# Patient Record
Sex: Male | Born: 1985 | Race: White | Hispanic: No | Marital: Single | Smoking: Never smoker
Health system: Southern US, Community
[De-identification: ages and names within clinical notes are randomized; demographics above are authoritative.]

## PROBLEM LIST (undated history)

## (undated) DIAGNOSIS — F1910 Other psychoactive substance abuse, uncomplicated: Secondary | ICD-10-CM

## (undated) DIAGNOSIS — L409 Psoriasis, unspecified: Secondary | ICD-10-CM

## (undated) HISTORY — PX: APPENDECTOMY: SHX54

---

## 2015-05-02 ENCOUNTER — Emergency Department (HOSPITAL_COMMUNITY): Payer: Self-pay

## 2015-05-02 ENCOUNTER — Emergency Department (HOSPITAL_COMMUNITY)
Admission: EM | Admit: 2015-05-02 | Discharge: 2015-05-02 | Disposition: A | Discharge status: Home / Self Care | Payer: Self-pay | Attending: Emergency Medicine | Admitting: Emergency Medicine

## 2015-05-02 ENCOUNTER — Encounter (HOSPITAL_COMMUNITY): Payer: Self-pay

## 2015-05-02 DIAGNOSIS — Z872 Personal history of diseases of the skin and subcutaneous tissue: Secondary | ICD-10-CM | POA: Insufficient documentation

## 2015-05-02 DIAGNOSIS — J069 Acute upper respiratory infection, unspecified: Secondary | ICD-10-CM | POA: Insufficient documentation

## 2015-05-02 DIAGNOSIS — K0889 Other specified disorders of teeth and supporting structures: Secondary | ICD-10-CM

## 2015-05-02 DIAGNOSIS — K029 Dental caries, unspecified: Secondary | ICD-10-CM | POA: Insufficient documentation

## 2015-05-02 HISTORY — DX: Psoriasis, unspecified: L40.9

## 2015-05-02 MED ORDER — IBUPROFEN 800 MG PO TABS
800.0000 mg | ORAL_TABLET | Freq: Once | ORAL | Status: AC
  Administered 2015-05-02: 800 mg via ORAL
  Filled 2015-05-02: qty 1

## 2015-05-02 MED ORDER — OXYCODONE-ACETAMINOPHEN 5-325 MG PO TABS: 1.0000 | ORAL_TABLET | ORAL | Status: DC | PRN

## 2015-05-02 MED ORDER — AMOXICILLIN 500 MG PO CAPS: 500.0000 mg | ORAL_CAPSULE | Freq: Three times a day (TID) | ORAL | Status: DC

## 2015-05-02 NOTE — ED Notes (Signed)
Pt c/o toothache x 1 month and cold symptoms x 4 days.

## 2015-05-02 NOTE — Discharge Instructions (Signed)
Meds for pain and antibiotics.  Referral to dentist.  OTC cough syrup.

## 2015-05-02 NOTE — ED Provider Notes (Signed)
CSN: [id number]     Arrival date & time 05/02/15  [id number] History   First MD Initiated Contact with Patient 05/02/15 [phone]     Chief Complaint  Patient presents with  . URI  . Dental Pain     (Consider location/radiation/quality/duration/timing/severity/associated sxs/prior Treatment) HPI.... Toothache for several weeks in left lower mandible. No fever, sweats, chills, stiff neck. Patient is also had a cough for 1 week. He is normally healthy. Severity of symptoms is mild.  Past Medical History  Diagnosis Date  . Psoriasis    Past Surgical History  Procedure Laterality Date  . Appendectomy     No family history on file. Social History  Substance Use Topics  . Smoking status: Never Smoker   . Smokeless tobacco: None  . Alcohol Use: No    Review of Systems  All other systems reviewed and are negative.     Allergies  Review of patient's allergies indicates no known allergies.  Home Medications   Prior to Admission medications   Medication Sig Start Date End Date Taking? Authorizing Provider  amoxicillin (AMOXIL) 500 MG capsule Take 1 capsule (500 mg total) by mouth 3 (three) times daily. 05/02/15   Donnetta HutchingBrian Tidus Upchurch, MD  oxyCODONE-acetaminophen (PERCOCET) 5-325 MG tablet Take 1-2 tablets by mouth every 4 (four) hours as needed. 05/02/15   Donnetta HutchingBrian Deirdre Gryder, MD   BP 141/99 mmHg  Pulse 109  Temp(Src) 99.8 F (37.7 C) (Oral)  Resp 18  Ht 5\' 11"  (1.803 m)  Wt 225 lb (102.059 kg)  BMI 31.39 kg/m2  SpO2 97% Physical Exam  Constitutional: He is oriented to person, place, and time. He appears well-developed and well-nourished.  HENT:  Obvious caries on tooth #18  Eyes: Conjunctivae and EOM are normal. Pupils are equal, round, and reactive to light.  Neck: Normal range of motion. Neck supple.  Cardiovascular: Normal rate and regular rhythm.   Pulmonary/Chest: Effort normal and breath sounds normal.  Lungs clear  Abdominal: Soft. Bowel sounds are normal.  Musculoskeletal: Normal range  of motion.  Neurological: He is alert and oriented to person, place, and time.  Skin: Skin is warm and dry.  Psychiatric: He has a normal mood and affect. His behavior is normal.  Nursing note and vitals reviewed.   ED Course  Procedures (including critical care time) Labs Review Labs Reviewed - No data to display  Imaging Review Dg Chest 2 View  05/02/2015  CLINICAL DATA:  Cold symptoms for 4 days.  Tooth ache for 1 month. EXAM: CHEST  2 VIEW COMPARISON:  None. FINDINGS: Midline trachea.  Normal heart size and mediastinal contours. Sharp costophrenic angles.  No pneumothorax.  Clear lungs. IMPRESSION: Normal chest. Electronically Signed   By: Jeronimo GreavesKyle  Talbot M.D.   On: 05/02/2015 07:15   I have personally reviewed and evaluated these images and lab results as part of my medical decision-making.   EKG Interpretation None      MDM   Final diagnoses:  URI (upper respiratory infection)  Tooth ache    Patient is in no acute distress. URI appears viral. Rx amoxicillin 500 mg 3 times a day and Percocet. Referral to dentist.    Donnetta HutchingBrian Jaizon Deroos, MD 05/02/15 [phone]

## 2015-06-10 ENCOUNTER — Emergency Department (HOSPITAL_COMMUNITY)
Admission: EM | Admit: 2015-06-10 | Discharge: 2015-06-10 | Disposition: A | Discharge status: Home / Self Care | Payer: Self-pay | Attending: Emergency Medicine | Admitting: Emergency Medicine

## 2015-06-10 ENCOUNTER — Encounter (HOSPITAL_COMMUNITY): Payer: Self-pay | Admitting: Emergency Medicine

## 2015-06-10 DIAGNOSIS — L03114 Cellulitis of left upper limb: Secondary | ICD-10-CM | POA: Insufficient documentation

## 2015-06-10 MED ORDER — DOXYCYCLINE HYCLATE 100 MG PO TABS
100.0000 mg | ORAL_TABLET | Freq: Once | ORAL | Status: AC
  Administered 2015-06-10: 100 mg via ORAL
  Filled 2015-06-10: qty 1

## 2015-06-10 MED ORDER — DOXYCYCLINE HYCLATE 100 MG PO CAPS: 100.0000 mg | ORAL_CAPSULE | Freq: Two times a day (BID) | ORAL | Status: DC

## 2015-06-10 NOTE — ED Provider Notes (Signed)
CSN: [id number]     Arrival date & time 06/10/15  1807 History   First MD Initiated Contact with Patient 06/10/15 1855     Chief Complaint  Patient presents with  . Insect Bite    The history is provided by the patient.  Patient presents with left forearm pain/redness for one day It is worsening Worsened with palpation and improved with rest No trauma to arm He thinks it is due to "bug bite" No fever/vomiting He feels the redness is worsening He has never had this before  Past Medical History  Diagnosis Date  . Psoriasis    Past Surgical History  Procedure Laterality Date  . Appendectomy     History reviewed. No pertinent family history. Social History  Substance Use Topics  . Smoking status: Never Smoker   . Smokeless tobacco: Never Used  . Alcohol Use: No    Review of Systems  Constitutional: Negative for fever.  Respiratory: Negative for shortness of breath.   Cardiovascular: Negative for chest pain.  Gastrointestinal: Negative for vomiting.  Skin: Positive for wound.  All other systems reviewed and are negative.     Allergies  Review of patient's allergies indicates no known allergies.  Home Medications   Prior to Admission medications   Medication Sig Start Date End Date Taking? Authorizing Provider  doxycycline (VIBRAMYCIN) 100 MG capsule Take 1 capsule (100 mg total) by mouth 2 (two) times daily. One po bid x 7 days 06/10/15   Zadie Rhineonald Sophya Vanblarcom, MD   BP 125/77 mmHg  Pulse 90  Temp(Src) 98.7 F (37.1 C) (Oral)  Resp 18  Ht 5\' 11"  (1.803 m)  Wt 102.059 kg  BMI 31.39 kg/m2  SpO2 100% Physical Exam CONSTITUTIONAL: Well developed/well nourished HEAD: Normocephalic/atraumatic EYES: EOMI ENMT: Mucous membranes moist NECK: supple no meningeal signs CV: S1/S2 noted, no murmurs/rubs/gallops noted LUNGS: Lungs are clear to auscultation bilaterally, no apparent distress ABDOMEN: soft NEURO: Pt is awake/alert/appropriate, moves all extremitiesx4.    EXTREMITIES: pulses normal/equal, full ROM, erythema/induration to volar left forearm but no crepitus.  No fluctuance.  It does not extend into wrist or elbow.  He has multiple healed scabs to left hand/wrist and left AC fossa but none appear infected SKIN: warm, color normal, erythema to left forearm PSYCH: no abnormalities of mood noted, alert and oriented to situation  ED Course  Procedures   MDM   Final diagnoses:  Cellulitis of left upper extremity    Nursing notes including past medical history and social history reviewed and considered in documentation   Pt with cellulitis to left FA He is well appearing He is not septic appearing No crepitus No signs of abscess at this time  Start doxycycline and advised recheck in 48 hours    Zadie Rhineonald Ferris Tally, MD 06/10/15 1942

## 2015-06-10 NOTE — Discharge Instructions (Signed)

## 2015-06-10 NOTE — ED Notes (Signed)
Patient c/o bug bite to left forearm. Per patient saw bug but unsure what kind. Swelling, redness, warmth and tenderness noted to forearm. Bruising also noted. Patient denies any fevers.

## 2015-08-11 ENCOUNTER — Encounter (HOSPITAL_COMMUNITY): Payer: Self-pay | Admitting: *Deleted

## 2015-08-11 ENCOUNTER — Emergency Department (HOSPITAL_COMMUNITY)
Admission: EM | Admit: 2015-08-11 | Discharge: 2015-08-12 | Disposition: A | Discharge status: Home / Self Care | Payer: Self-pay | Attending: Emergency Medicine | Admitting: Emergency Medicine

## 2015-08-11 DIAGNOSIS — S239XXA Sprain of unspecified parts of thorax, initial encounter: Secondary | ICD-10-CM | POA: Insufficient documentation

## 2015-08-11 DIAGNOSIS — Y9389 Activity, other specified: Secondary | ICD-10-CM | POA: Insufficient documentation

## 2015-08-11 DIAGNOSIS — S233XXA Sprain of ligaments of thoracic spine, initial encounter: Secondary | ICD-10-CM

## 2015-08-11 DIAGNOSIS — S29012A Strain of muscle and tendon of back wall of thorax, initial encounter: Secondary | ICD-10-CM | POA: Insufficient documentation

## 2015-08-11 DIAGNOSIS — Z872 Personal history of diseases of the skin and subcutaneous tissue: Secondary | ICD-10-CM | POA: Insufficient documentation

## 2015-08-11 DIAGNOSIS — S199XXA Unspecified injury of neck, initial encounter: Secondary | ICD-10-CM | POA: Insufficient documentation

## 2015-08-11 DIAGNOSIS — S0003XA Contusion of scalp, initial encounter: Secondary | ICD-10-CM | POA: Insufficient documentation

## 2015-08-11 DIAGNOSIS — Y92410 Unspecified street and highway as the place of occurrence of the external cause: Secondary | ICD-10-CM | POA: Insufficient documentation

## 2015-08-11 DIAGNOSIS — Y998 Other external cause status: Secondary | ICD-10-CM | POA: Insufficient documentation

## 2015-08-11 MED ORDER — ACETAMINOPHEN-CODEINE #3 300-30 MG PO TABS
2.0000 | ORAL_TABLET | Freq: Once | ORAL | Status: AC
  Administered 2015-08-12: 2 via ORAL
  Filled 2015-08-11: qty 2

## 2015-08-11 MED ORDER — IBUPROFEN 800 MG PO TABS
800.0000 mg | ORAL_TABLET | Freq: Once | ORAL | Status: AC
  Administered 2015-08-12: 800 mg via ORAL
  Filled 2015-08-11: qty 1

## 2015-08-11 MED ORDER — DIAZEPAM 5 MG PO TABS
5.0000 mg | ORAL_TABLET | Freq: Once | ORAL | Status: AC
  Administered 2015-08-12: 5 mg via ORAL
  Filled 2015-08-11: qty 1

## 2015-08-11 MED ORDER — ACETAMINOPHEN-CODEINE #3 300-30 MG PO TABS: 1.0000 | ORAL_TABLET | Freq: Four times a day (QID) | ORAL | Status: DC | PRN

## 2015-08-11 MED ORDER — CYCLOBENZAPRINE HCL 10 MG PO TABS: 10.0000 mg | ORAL_TABLET | Freq: Three times a day (TID) | ORAL | Status: DC

## 2015-08-11 MED ORDER — ONDANSETRON HCL 4 MG PO TABS
4.0000 mg | ORAL_TABLET | Freq: Once | ORAL | Status: AC
  Administered 2015-08-12: 4 mg via ORAL
  Filled 2015-08-11: qty 1
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
## 2016-04-09 ENCOUNTER — Emergency Department (HOSPITAL_COMMUNITY)
Admission: EM | Admit: 2016-04-09 | Discharge: 2016-04-09 | Disposition: A | Discharge status: Home / Self Care | Payer: Self-pay | Attending: Emergency Medicine | Admitting: Emergency Medicine

## 2016-04-09 ENCOUNTER — Encounter (HOSPITAL_COMMUNITY): Payer: Self-pay

## 2016-04-09 DIAGNOSIS — F1910 Other psychoactive substance abuse, uncomplicated: Secondary | ICD-10-CM | POA: Insufficient documentation

## 2016-04-09 DIAGNOSIS — F32A Depression, unspecified: Secondary | ICD-10-CM

## 2016-04-09 DIAGNOSIS — Z5181 Encounter for therapeutic drug level monitoring: Secondary | ICD-10-CM | POA: Insufficient documentation

## 2016-04-09 DIAGNOSIS — F329 Major depressive disorder, single episode, unspecified: Secondary | ICD-10-CM | POA: Insufficient documentation

## 2016-04-09 LAB — RAPID URINE DRUG SCREEN, HOSP PERFORMED
AMPHETAMINES: NOT DETECTED
Barbiturates: NOT DETECTED
Benzodiazepines: POSITIVE — AB
Cocaine: POSITIVE — AB
Opiates: POSITIVE — AB
TETRAHYDROCANNABINOL: NOT DETECTED

## 2016-04-09 LAB — CBC WITH DIFFERENTIAL/PLATELET
Basophils Absolute: 0.1 10*3/uL (ref 0.0–0.1)
Basophils Relative: 1 %
EOS PCT: 5 %
Eosinophils Absolute: 0.5 10*3/uL (ref 0.0–0.7)
HCT: 39.4 % (ref 39.0–52.0)
Hemoglobin: 13.2 g/dL (ref 13.0–17.0)
LYMPHS ABS: 3.3 10*3/uL (ref 0.7–4.0)
LYMPHS PCT: 39 %
MCH: 28.8 pg (ref 26.0–34.0)
MCHC: 33.5 g/dL (ref 30.0–36.0)
MCV: 85.8 fL (ref 78.0–100.0)
MONOS PCT: 11 %
Monocytes Absolute: 1 10*3/uL (ref 0.1–1.0)
Neutro Abs: 3.7 10*3/uL (ref 1.7–7.7)
Neutrophils Relative %: 44 %
PLATELETS: 381 10*3/uL (ref 150–400)
RBC: 4.59 MIL/uL (ref 4.22–5.81)
RDW: 13.8 % (ref 11.5–15.5)
WBC: 8.5 10*3/uL (ref 4.0–10.5)

## 2016-04-09 LAB — BASIC METABOLIC PANEL
Anion gap: 7 (ref 5–15)
BUN: 15 mg/dL (ref 6–20)
CHLORIDE: 101 mmol/L (ref 101–111)
CO2: 28 mmol/L (ref 22–32)
CREATININE: 0.84 mg/dL (ref 0.61–1.24)
Calcium: 9.4 mg/dL (ref 8.9–10.3)
GFR calc non Af Amer: >60 mL/min (ref >60)
GLUCOSE: 117 mg/dL — AB (ref 65–99)
Potassium: 4.2 mmol/L (ref 3.5–5.1)
Sodium: 136 mmol/L (ref 135–145)

## 2016-04-09 LAB — ETHANOL: Alcohol, Ethyl (B): 10 mg/dL — ABNORMAL HIGH (ref <5)

## 2016-04-09 NOTE — Discharge Instructions (Signed)
Take your usual prescriptions as previously directed.  Call your regular medical doctor and the mental health resources given to you today, to schedule a follow up appointment this week.  Return to the Emergency Department immediately sooner if worsening.

## 2016-04-09 NOTE — BH Assessment (Signed)
Tele Assessment Note   Nicholas Ritter is an 30 y.o. male. Pt reports passive SI for 2 weeks. Pt states he had recent stressors that triggered a depression, and he began to have suicidal thoughts. Pt denied a SI plan. Pt denied HI and AVH. Pt denied previous inpatient or outpatient treatment. Pt reports the following stressors: job loss and a new home. Pt states he recently found a new job. Pt reports family and friend support. Pt states he resides with his girlfriend and he talks to her about her stress. Pt reports recent opioid use. Pt states he cannot name the opioid he used. Pt denies alcohol use. Pt denies abuse.  Writer consulted with Jacki Cones, NP. Per Jacki Cones Pt does not meet inpatient criteria. Faxed outpatient resources to ED.   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 Psoriasis     Past Surgical History:  Procedure Laterality Date  . APPENDECTOMY      Family History: No family history on file.  Social History:  reports that he has never smoked. He has never used smokeless tobacco. He reports that he does not drink alcohol or use drugs.  Additional Social History:  Alcohol / Drug Use Pain Medications: Pt denies Prescriptions: Pt denies Over the Counter: Pt denies History of alcohol / drug use?: Yes Longest period of sobriety (when/how long): unknown Substance #1 Name of Substance 1: opiates 1 - Age of First Use: 30 1 - Amount (size/oz): unknown 1 - Frequency: occasional 1 - Duration: ongoing 1 - Last Use / Amount: unknown  CIWA: CIWA-Ar BP: 118/71 Pulse Rate: 65 COWS:    PATIENT STRENGTHS: (choose at least two) Average or above average intelligence Supportive family/friends Work skills  Allergies: No Known Allergies  Home Medications:  (Not in a hospital admission)  OB/GYN Status:  No LMP for male patient.  General Assessment Data Location of Assessment: [facility] TTS Assessment: In  system Is this a Tele or Face-to-Face Assessment?: Tele Assessment Is this an Initial Assessment or a Re-assessment for this encounter?: Initial Assessment Marital status: Single Maiden name: NA Is patient pregnant?: No Pregnancy Status: No Living Arrangements: Spouse/significant other Can pt return to current living arrangement?: Yes Admission Status: Voluntary Is patient capable of signing voluntary admission?: Yes Referral Source: Self/Family/Friend Insurance type: SP     Crisis Care Plan Living Arrangements: Spouse/significant other Legal Guardian: Other: (self) Name of Psychiatrist: NA Name of Therapist: NA  Education Status Is patient currently in school?: No Current Grade: NA Highest grade of school patient has completed: 12 Name of school: NA Contact person: NA  Risk to self with the past 6 months Suicidal Ideation: No-Not Currently/Within Last 6 Months Has patient been a risk to self within the past 6 months prior to admission? : No Suicidal Intent: No Has patient had any suicidal intent within the past 6 months prior to admission? : No Is patient at risk for suicide?: No Suicidal Plan?: No Has patient had any suicidal plan within the past 6 months prior to admission? : No Access to Means: No What has been your use of drugs/alcohol within the last 12 months?: opiates Previous Attempts/Gestures: No How many times?: 0 Other Self Harm Risks: NA Triggers for Past Attempts: None known Intentional Self Injurious Behavior: None Family Suicide History: No Recent stressful life event(s): Job Loss Persecutory voices/beliefs?: No Depression: Yes Depression Symptoms: Isolating, Loss of interest in usual pleasures, Feeling worthless/self pity,  Feeling angry/irritable Substance abuse history and/or treatment for substance abuse?: No Suicide prevention information given to non-admitted patients: Not applicable  Risk to Others within the past 6 months Homicidal Ideation:  No Does patient have any lifetime risk of violence toward others beyond the six months prior to admission? : No Thoughts of Harm to Others: No Current Homicidal Intent: No Current Homicidal Plan: No Access to Homicidal Means: No Identified Victim: nA History of harm to others?: No Assessment of Violence: None Noted Violent Behavior Description: NA Does patient have access to weapons?: No Criminal Charges Pending?: No Does patient have a court date: No Is patient on probation?: No  Psychosis Hallucinations: None noted Delusions: None noted  Mental Status Report Appearance/Hygiene: Unremarkable Eye Contact: Good Motor Activity: Freedom of movement Speech: Logical/coherent Level of Consciousness: Alert Mood: Depressed Affect: Depressed Anxiety Level: None Thought Processes: Coherent, Relevant Judgement: Impaired Orientation: Place, Person, Time, Situation Obsessive Compulsive Thoughts/Behaviors: None  Cognitive Functioning Concentration: Normal Memory: Recent Intact, Remote Intact IQ: Average Insight: Fair Impulse Control: Fair Appetite: Fair Weight Loss: 0 Weight Gain: 0 Sleep: Decreased Total Hours of Sleep: 7 Vegetative Symptoms: None  ADLScreening [facility] Assessment [facility]) Patient's cognitive ability adequate to safely complete daily activities?: Yes Patient able to express need for assistance with ADLs?: Yes Independently performs ADLs?: Yes (appropriate for developmental age)  Prior Inpatient Therapy Prior Inpatient Therapy: No Prior Therapy Dates: NA Prior Therapy Facilty/Provider(s): NA Reason for Treatment: NA  Prior Outpatient Therapy Prior Outpatient Therapy: No Prior Therapy Dates: NA Prior Therapy Facilty/Provider(s): NA Reason for Treatment: NA Does patient have an ACCT team?: No Does patient have Intensive In-House Services?  : No Does patient have [facility] services? : No Does patient have P4CC services?: No  ADL Screening (condition at  time of admission) Patient's cognitive ability adequate to safely complete daily activities?: Yes Is the patient deaf or have difficulty hearing?: No Does the patient have difficulty seeing, even when wearing glasses/contacts?: No Does the patient have difficulty concentrating, remembering, or making decisions?: Yes Patient able to express need for assistance with ADLs?: Yes Independently performs ADLs?: Yes (appropriate for developmental age) Does the patient have difficulty walking or climbing stairs?: No Weakness of Legs: None Weakness of Arms/Hands: None       Abuse/Neglect Assessment (Assessment to be complete while patient is alone) Physical Abuse: Denies Verbal Abuse: Denies Sexual Abuse: Denies Exploitation of patient/patient's resources: Denies Self-Neglect: Denies     Merchant navy officerAdvance Directives (For Healthcare) Does patient have an advance directive?: No Would patient like information on creating an advanced directive?: No - patient declined information    Additional Information 1:1 In Past 12 Months?: No CIRT Risk: No Elopement Risk: No Does patient have medical clearance?: No     Disposition:  Disposition Initial Assessment Completed for this Encounter: Yes Disposition of Patient: Outpatient treatment Type of outpatient treatment: Adult  Brandn Mcgath D 04/09/2016 2:27 PM

## 2016-04-09 NOTE — ED Provider Notes (Signed)
[facility] Provider Note   CSN: [id number] Arrival date & time: 04/09/16  1122     History   Chief Complaint Chief Complaint  Patient presents with  . V70.1    HPI Nicholas Ritter is a 30 y.o. male.  HPI Pt was seen at 1145. Per pt, c/o gradual onset and worsening of persistent depression for the past several weeks. Has been associated with vague SI. Denies plan. States he is "under a lot of stress" after "buying a house then losing my job." Denies SA, no HI, no hallucination.    Past Medical History:  Diagnosis Date  . Psoriasis     There are no active problems to display for this patient.   Past Surgical History:  Procedure Laterality Date  . APPENDECTOMY         Home Medications    Prior to Admission medications   Medication Sig Start Date End Date Taking? Authorizing Provider  Melatonin 3 MG CAPS Take 3 capsules by mouth at bedtime.   Yes Historical Provider, MD    Family History No family history on file.  Social History Social History  Substance Use Topics  . Smoking status: Never Smoker  . Smokeless tobacco: Never Used  . Alcohol use No     Allergies   Review of patient's allergies indicates no known allergies.   Review of Systems Review of Systems ROS: Statement: All systems negative except as marked or noted in the HPI; Constitutional: Negative for fever and chills. ; ; Eyes: Negative for eye pain, redness and discharge. ; ; ENMT: Negative for ear pain, hoarseness, nasal congestion, sinus pressure and sore throat. ; ; Cardiovascular: Negative for chest pain, palpitations, diaphoresis, dyspnea and peripheral edema. ; ; Respiratory: Negative for cough, wheezing and stridor. ; ; Gastrointestinal: Negative for nausea, vomiting, diarrhea, abdominal pain, blood in stool, hematemesis, jaundice and rectal bleeding. . ; ; Genitourinary: Negative for dysuria, flank pain and hematuria. ; ; Musculoskeletal: Negative for back pain and neck pain.  Negative for swelling and trauma.; ; Skin: Negative for pruritus, rash, abrasions, blisters, bruising and skin lesion.; ; Neuro: Negative for headache, lightheadedness and neck stiffness. Negative for weakness, altered level of consciousness, altered mental status, extremity weakness, paresthesias, involuntary movement, seizure and syncope.; Psych:  +depression, +vague SI without plan. No SA, no HI, no hallucinations.      Physical Exam Updated Vital Signs BP 118/71 (BP Location: Right Arm)   Pulse 65   Resp 18   Ht 5\' 10"  (1.778 m)   Wt 220 lb (99.8 kg)   SpO2 96%   BMI 31.57 kg/m   Physical Exam 1150: Physical examination:  Nursing notes reviewed; Vital signs and O2 SAT reviewed;  Constitutional: Well developed, Well nourished, Well hydrated, In no acute distress; Head:  Normocephalic, atraumatic; Eyes: EOMI, PERRL, No scleral icterus; ENMT: Mouth and pharynx normal, Mucous membranes moist; Neck: Supple, Full range of motion; Cardiovascular: Regular rate and rhythm; Respiratory: Breath sounds clear, No wheezes.  Speaking full sentences with ease, Normal respiratory effort/excursion; Chest: No deformity, Movement normal; Abdomen: Nondistended; Extremities: No deformity.; Neuro: AA&Ox3, Major CN grossly intact.  Speech clear. No gross focal motor deficits in extremities. Climbs on and off stretcher easily by himself. Gait steady.; Skin: Color normal, Warm, Dry.; Psych:  Affect flat.    ED Treatments / Results  Labs (all labs ordered are listed, but only abnormal results are displayed)   EKG  EKG Interpretation None  Radiology   Procedures Procedures (including critical care time)  Medications Ordered in ED Medications - No data to display   Initial Impression / Assessment and Plan / ED Course  I have reviewed the triage vital signs and the nursing notes.  Pertinent labs & imaging results that were available during my care of the patient were reviewed by me and  considered in my medical decision making (see chart for details).  MDM Reviewed: nursing note, vitals and previous chart Reviewed previous: labs Interpretation: labs   Results for orders placed or performed during the hospital encounter of 04/09/16  Basic metabolic panel  Result Value Ref Range   Sodium 136 135 - 145 mmol/L   Potassium 4.2 3.5 - 5.1 mmol/L   Chloride 101 101 - 111 mmol/L   CO2 28 22 - 32 mmol/L   Glucose, Bld 117 (H) 65 - 99 mg/dL   BUN 15 6 - 20 mg/dL   Creatinine, Ser 1.61 0.61 - 1.24 mg/dL   Calcium 9.4 8.9 - 09.6 mg/dL   GFR calc non Af Amer >60 >60 mL/min   GFR calc Af Amer >60 >60 mL/min   Anion gap 7 5 - 15  Ethanol  Result Value Ref Range   Alcohol, Ethyl (B) 10 (H) <5 mg/dL  CBC with Differential  Result Value Ref Range   WBC 8.5 4.0 - 10.5 K/uL   RBC 4.59 4.22 - 5.81 MIL/uL   Hemoglobin 13.2 13.0 - 17.0 g/dL   HCT 04.5 40.9 - 81.1 %   MCV 85.8 78.0 - 100.0 fL   MCH 28.8 26.0 - 34.0 pg   MCHC 33.5 30.0 - 36.0 g/dL   RDW 91.4 78.2 - 95.6 %   Platelets 381 150 - 400 K/uL   Neutrophils Relative % 44 %   Neutro Abs 3.7 1.7 - 7.7 K/uL   Lymphocytes Relative 39 %   Lymphs Abs 3.3 0.7 - 4.0 K/uL   Monocytes Relative 11 %   Monocytes Absolute 1.0 0.1 - 1.0 K/uL   Eosinophils Relative 5 %   Eosinophils Absolute 0.5 0.0 - 0.7 K/uL   Basophils Relative 1 %   Basophils Absolute 0.1 0.0 - 0.1 K/uL  Urine rapid drug screen (hosp performed)  Result Value Ref Range   Opiates POSITIVE (A) NONE DETECTED   Cocaine POSITIVE (A) NONE DETECTED   Benzodiazepines POSITIVE (A) NONE DETECTED   Amphetamines NONE DETECTED NONE DETECTED   Tetrahydrocannabinol NONE DETECTED NONE DETECTED   Barbiturates NONE DETECTED NONE DETECTED    1325:  TTS eval pending.   1600:  TTS has evaluated pt: states pt does not meet inpatient criteria and can be discharged with outpatient resources. Dx and testing d/w pt.  Questions answered.  Verb understanding, agreeable to d/c home  with outpt f/u.   Final Clinical Impressions(s) / ED Diagnoses   Final diagnoses:  Suicidal ideation  Depression, unspecified depression type    New Prescriptions New Prescriptions   No medications on file     [name], DO 04/10/16 2130

## 2016-04-09 NOTE — ED Triage Notes (Signed)
Pt states he has been under a lot of stress. States he just bought a house and lost his job. States he feels suicidal but has not plan to hurt himself.

## 2016-04-25 ENCOUNTER — Encounter (HOSPITAL_COMMUNITY): Payer: Self-pay | Admitting: Emergency Medicine

## 2016-04-25 ENCOUNTER — Emergency Department (HOSPITAL_COMMUNITY)
Admission: EM | Admit: 2016-04-25 | Discharge: 2016-04-26 | Disposition: A | Discharge status: Home / Self Care | Payer: Self-pay | Attending: Emergency Medicine | Admitting: Emergency Medicine

## 2016-04-25 DIAGNOSIS — Z792 Long term (current) use of antibiotics: Secondary | ICD-10-CM | POA: Insufficient documentation

## 2016-04-25 DIAGNOSIS — Z791 Long term (current) use of non-steroidal anti-inflammatories (NSAID): Secondary | ICD-10-CM | POA: Insufficient documentation

## 2016-04-25 DIAGNOSIS — F1910 Other psychoactive substance abuse, uncomplicated: Secondary | ICD-10-CM | POA: Insufficient documentation

## 2016-04-25 DIAGNOSIS — Z79899 Other long term (current) drug therapy: Secondary | ICD-10-CM | POA: Insufficient documentation

## 2016-04-25 DIAGNOSIS — L0291 Cutaneous abscess, unspecified: Secondary | ICD-10-CM

## 2016-04-25 DIAGNOSIS — Z23 Encounter for immunization: Secondary | ICD-10-CM | POA: Insufficient documentation

## 2016-04-25 DIAGNOSIS — L02414 Cutaneous abscess of left upper limb: Secondary | ICD-10-CM | POA: Insufficient documentation

## 2016-04-25 NOTE — ED Triage Notes (Signed)
Pt admits to IV drug use and states [facility].  Pt states he noticed the start of swelling and redness to area on Monday, and area has increased in size since then

## 2016-04-26 MED ORDER — IBUPROFEN 600 MG PO TABS: 600.0000 mg | ORAL_TABLET | Freq: Four times a day (QID) | ORAL | 0 refills | Status: DC | PRN

## 2016-04-26 MED ORDER — CLINDAMYCIN HCL 150 MG PO CAPS: 300.0000 mg | ORAL_CAPSULE | Freq: Three times a day (TID) | ORAL | 0 refills | Status: DC

## 2016-04-26 MED ORDER — TETANUS-DIPHTH-ACELL PERTUSSIS 5-2.5-18.5 LF-MCG/0.5 IM SUSP
0.5000 mL | Freq: Once | INTRAMUSCULAR | Status: AC
  Administered 2016-04-26: 0.5 mL via INTRAMUSCULAR
  Filled 2016-04-26: qty 0.5

## 2016-04-26 MED ORDER — LIDOCAINE-EPINEPHRINE (PF) 2 %-1:200000 IJ SOLN
10.0000 mL | Freq: Once | INTRAMUSCULAR | Status: AC
  Administered 2016-04-26: 10 mL
  Filled 2016-04-26: qty 20

## 2016-04-26 NOTE — Discharge Instructions (Signed)
Keep your wound clean using antibacterial soap and water, pat dry. He may apply ice to affected area for 15 minutes 3-4 times daily for additional pain relief and to help with swelling. Return to the emergency department in 48 hours for wound recheck if your wound has not improved or has worsened. Please return to the Emergency Department if symptoms worsen or new onset of fever, worsening redness/swelling, drainage, numbness, tingling, weakness, decreased range of motion.

## 2016-04-26 NOTE — ED Notes (Signed)
Pt states understanding of care given and follow up instructions.  Ambulated from ED with mother and girlfriend.

## 2016-04-26 NOTE — ED Provider Notes (Signed)
[facility] Provider Note   CSN: [id number] Arrival date & time: 04/25/16  2330     History   Chief Complaint Chief Complaint  Patient presents with  . Cellulitis    HPI Nicholas Ritter is a 30 y.o. male.  Patient is a 30 year old male with history of IV drug abuse who presents the ED with complaint of left arm abscess. Patient reports last week he crushed up Percocet and injected it into his left before meals. He reports 2 days later he began having redness, mild pain and swelling to the area. He reports having significant worsening of swelling and associated pain and notes he was unable to fully flex or extend his left elbow 2 days ago. He reports taking 2 of his mothers amoxicillin at home and states he has had significant improvement of his redness and swelling and notes he is now able to fully flex and extend his elbow. Denies fever, chills, chest pain, shortness of breath, abdominal pain, nausea, vomiting, numbness, tingling, weakness, drainage, streaking. Patient denies trying to open the abscess himself at home. He notes the last time he has used IV drugs to his left before meals was last week. Patient reports he does not share needles but notes he will sometimes reuse his own needle. Tetanus status unknown.       Past Medical History:  Diagnosis Date  . Psoriasis     There are no active problems to display for this patient.   Past Surgical History:  Procedure Laterality Date  . APPENDECTOMY         Home Medications    Prior to Admission medications   Medication Sig Start Date End Date Taking? Authorizing Provider  clindamycin (CLEOCIN) 150 MG capsule Take 2 capsules (300 mg total) by mouth 3 (three) times daily. May dispense as 150mg  capsules 04/26/16   Barrett Henle, PA-C  ibuprofen (ADVIL,MOTRIN) 600 MG tablet Take 1 tablet (600 mg total) by mouth every 6 (six) hours as needed. 04/26/16   Barrett Henle, PA-C  Melatonin 3 MG CAPS Take 3  capsules by mouth at bedtime.    Historical Provider, MD    Family History History reviewed. No pertinent family history.  Social History Social History  Substance Use Topics  . Smoking status: Never Smoker  . Smokeless tobacco: Never Used  . Alcohol use No     Allergies   Review of patient's allergies indicates no known allergies.   Review of Systems Review of Systems  Skin: Positive for wound (abscess).  All other systems reviewed and are negative.    Physical Exam Updated Vital Signs BP 111/95 (BP Location: Right Arm)   Pulse 120   Temp 98.3 F (36.8 C) (Oral)   Resp 16   Ht 5\' 10"  (1.778 m)   Wt 99.8 kg   SpO2 98%   BMI 31.57 kg/m   Physical Exam  Constitutional: He is oriented to person, place, and time. He appears well-developed and well-nourished.  HENT:  Head: Normocephalic and atraumatic.  Eyes: Conjunctivae and EOM are normal. Right eye exhibits no discharge. Left eye exhibits no discharge. No scleral icterus.  Neck: Normal range of motion. Neck supple.  Cardiovascular: Regular rhythm, normal heart sounds and intact distal pulses.   Tachycardic, HR 110  Pulmonary/Chest: Effort normal and breath sounds normal. No respiratory distress.  Abdominal: Soft. He exhibits no distension.  Musculoskeletal: Normal range of motion. He exhibits tenderness. He exhibits no edema or deformity.  Left elbow: He exhibits swelling. He exhibits normal range of motion, no effusion, no deformity and no laceration. No tenderness found.       Arms: FROM of left shoulder, elbow, forearm, wrist and hand with 5/5 strength. Sensation grossly intact. 2+ radial pulse. Cap refill <2. 3x2cm abscess noted to left medial AC. No streaking.   Neurological: He is alert and oriented to person, place, and time.  Skin: Skin is warm and dry.  3x2cm area of induration and fluctuance noted to left medial AC with erythema and warmth, mildly TTP. No surrounding swelling or drainage.   Nursing  note and vitals reviewed.    ED Treatments / Results  Labs (all labs ordered are listed, but only abnormal results are displayed) Labs Reviewed - No data to display  EKG  EKG Interpretation None       Radiology No results found.  Procedures .Marland Kitchen.Incision and Drainage Date/Time: 04/26/2016 1:15 AM Performed by: Barrett HenleNADEAU, Shannette Tabares ELIZABETH Authorized by: Barrett HenleNADEAU, Deannah Rossi ELIZABETH   Consent:    Consent obtained:  Verbal   Consent given by:  Patient Location:    Type:  Abscess   Size:  3x2cm   Location:  Upper extremity   Upper extremity location: Left medial AC region. Pre-procedure details:    Skin preparation:  Betadine Anesthesia (see MAR for exact dosages):    Anesthesia method:  Local infiltration   Local anesthetic:  Lidocaine 2% WITH epi Procedure type:    Complexity:  Simple Procedure details:    Incision types:  Single straight   Incision depth:  Dermal   Scalpel blade:  11   Wound management:  Probed and deloculated and irrigated with saline   Drainage:  Purulent and bloody   Drainage amount:  Moderate   Wound treatment:  Wound left open   Packing materials:  None Post-procedure details:    Patient tolerance of procedure:  Tolerated well, no immediate complications   (including critical care time)  Medications Ordered in ED Medications  lidocaine-EPINEPHrine (XYLOCAINE W/EPI) 2 %-1:200000 (PF) injection 10 mL (10 mLs Infiltration Given 04/26/16 0049)  Tdap (BOOSTRIX) injection 0.5 mL (0.5 mLs Intramuscular Given 04/26/16 0038)     Initial Impression / Assessment and Plan / ED Course  I have reviewed the triage vital signs and the nursing notes.  Pertinent labs & imaging results that were available during my care of the patient were reviewed by me and considered in my medical decision making (see chart for details).  Clinical Course    Patient presents with abscess to left before meals area that isn't present for the past week. He reports last  injecting Percocet into his left arm a few days prior to onset of swelling and redness. Denies fever or drainage. Reports abscess significantly improved after taking 2 doses of his moms left over prescription of amoxicillin. Initial HR 110, remaining vitals stable. Exam revealed 3x2cm abscess to left medial AC region with mild surrounding erythema. FROM of left elbow, forearm and wrist with 5/5 strength. 2+ radial pulse. Left arm neurovascularly intact. No streaking. Presentation consistent with superficial abscess. I&D performed without any complications. Tetanus updated. Plan to d/c pt home with clindamycin. Advised pt to return to the ED in 48 hours for wound recheck if wound worsens or does not improve. Discussed strict return precautions.   Final Clinical Impressions(s) / ED Diagnoses   Final diagnoses:  Abscess  IV drug abuse    New Prescriptions New Prescriptions   CLINDAMYCIN (CLEOCIN) 150 MG  CAPSULE    Take 2 capsules (300 mg total) by mouth 3 (three) times daily. May dispense as 150mg  capsules   IBUPROFEN (ADVIL,MOTRIN) 600 MG TABLET    Take 1 tablet (600 mg total) by mouth every 6 (six) hours as needed.     Satira Sarkicole Elizabeth WatervilleNadeau, [place] JerseyPA-C 04/26/16 [id number]    Devoria AlbeIva Knapp, MD 04/26/16 [phone]

## 2016-07-03 ENCOUNTER — Encounter (HOSPITAL_COMMUNITY): Payer: Self-pay

## 2016-07-03 ENCOUNTER — Emergency Department (HOSPITAL_COMMUNITY)
Admission: EM | Admit: 2016-07-03 | Discharge: 2016-07-03 | Disposition: A | Discharge status: Home / Self Care | Payer: Self-pay | Attending: Emergency Medicine | Admitting: Emergency Medicine

## 2016-07-03 DIAGNOSIS — K029 Dental caries, unspecified: Secondary | ICD-10-CM | POA: Insufficient documentation

## 2016-07-03 DIAGNOSIS — K0889 Other specified disorders of teeth and supporting structures: Secondary | ICD-10-CM

## 2016-07-03 MED ORDER — CLINDAMYCIN HCL 150 MG PO CAPS: 300.0000 mg | ORAL_CAPSULE | Freq: Four times a day (QID) | ORAL | 0 refills | Status: AC

## 2016-07-03 NOTE — ED Triage Notes (Addendum)
Pt c/o toothache for past few days.  Pt also reports is a recovering drug addict and does not want any narcotics.

## 2016-07-03 NOTE — Discharge Instructions (Signed)
Follow-up with a dentist soon.   °

## 2016-07-03 NOTE — ED Provider Notes (Signed)
[facility] Provider Note   CSN: [id number] Arrival date & time: 07/03/16  1449     History   Chief Complaint Chief Complaint  Patient presents with  . Dental Pain    HPI Nicholas Ritter is a 31 y.o. male.  HPI   Kasem Mozer is a 31 y.o. male who presents to the Emergency Department complaining of dental pain for several days.  He reports a sharp throbbing pain to his right lower tooth that is worse with chewing or cold food or liquids.  He also reports facial swelling to the left with a decayed tooth on the lower left, but states the tooth does not hurt.  He denies neck pain or swelling, fever, chills, and difficulty swallowing.  He has not tried to see a dentist. Pain is currently controlled with ibuprofen.  Pt requesting no narcotics.  Past Medical History:  Diagnosis Date  . Psoriasis     There are no active problems to display for this patient.   Past Surgical History:  Procedure Laterality Date  . APPENDECTOMY         Home Medications    Prior to Admission medications   Medication Sig Start Date End Date Taking? Authorizing Provider  clindamycin (CLEOCIN) 150 MG capsule Take 2 capsules (300 mg total) by mouth 3 (three) times daily. May dispense as 150mg  capsules 04/26/16   Barrett Henle, PA-C  ibuprofen (ADVIL,MOTRIN) 600 MG tablet Take 1 tablet (600 mg total) by mouth every 6 (six) hours as needed. 04/26/16   Barrett Henle, PA-C  Melatonin 3 MG CAPS Take 3 capsules by mouth at bedtime.    Historical Provider, MD    Family History No family history on file.  Social History Social History  Substance Use Topics  . Smoking status: Never Smoker  . Smokeless tobacco: Never Used  . Alcohol use No     Allergies   Patient has no known allergies.   Review of Systems Review of Systems  Constitutional: Negative for appetite change and fever.  HENT: Positive for dental problem. Negative for congestion, facial swelling, sore throat and  trouble swallowing.   Eyes: Negative for pain and visual disturbance.  Musculoskeletal: Negative for neck pain and neck stiffness.  Neurological: Negative for dizziness, facial asymmetry and headaches.  Hematological: Negative for adenopathy.  All other systems reviewed and are negative.    Physical Exam Updated Vital Signs BP 155/93 (BP Location: Left Arm)   Pulse 111   Temp 98.6 F (37 C) (Oral)   Resp 16   Ht 5\' 11"  (1.803 m)   Wt 95.3 kg   SpO2 100%   BMI 29.29 kg/m   Physical Exam  Constitutional: He is oriented to person, place, and time. He appears well-developed and well-nourished. No distress.  HENT:  Head: Normocephalic and atraumatic.  Right Ear: Tympanic membrane and ear canal normal.  Left Ear: Tympanic membrane and ear canal normal.  Mouth/Throat: Uvula is midline, oropharynx is clear and moist and mucous membranes are normal. No trismus in the jaw. Dental caries present. No dental abscesses or uvula swelling.  Tenderness and dental caries of the right lower second molar.  Mild edema of the adjacent gingiva. Dental caries of the left lower second molar.  No facial swelling, obvious dental abscess, trismus, or sublingual abnml.    Neck: Normal range of motion. Neck supple.  Cardiovascular: Normal rate, regular rhythm and normal heart sounds.   No murmur heard. Pulmonary/Chest: Effort normal and breath  sounds normal.  Musculoskeletal: Normal range of motion.  Lymphadenopathy:    He has no cervical adenopathy.  Neurological: He is alert and oriented to person, place, and time. He exhibits normal muscle tone. Coordination normal.  Skin: Skin is warm and dry.  Nursing note and vitals reviewed.    ED Treatments / Results  Labs (all labs ordered are listed, but only abnormal results are displayed) Labs Reviewed - No data to display  EKG  EKG Interpretation None       Radiology No results found.  Procedures Procedures (including critical care  time)  Medications Ordered in ED Medications - No data to display   Initial Impression / Assessment and Plan / ED Course  I have reviewed the triage vital signs and the nursing notes.  Pertinent labs & imaging results that were available during my care of the patient were reviewed by me and considered in my medical decision making (see chart for details).  Clinical Course     Dental caries w/o obvious dental abscess or concerning sx's for Ludwig's angina.  Pt stable for d/c.  Pain controlled with ibuprofen at home.  Rx for clindamycin  Final Clinical Impressions(s) / ED Diagnoses   Final diagnoses:  Pain, dental    New Prescriptions New Prescriptions   No medications on file     Rosey Bathammy Dominico Rod, PA-C 07/03/16 1627    Linwood DibblesJon Knapp, MD 07/07/16 2202

## 2016-07-14 ENCOUNTER — Emergency Department (HOSPITAL_COMMUNITY)
Admission: EM | Admit: 2016-07-14 | Discharge: 2016-07-14 | Disposition: A | Discharge status: Home / Self Care | Payer: Self-pay | Attending: Emergency Medicine | Admitting: Emergency Medicine

## 2016-07-14 ENCOUNTER — Emergency Department (HOSPITAL_COMMUNITY): Payer: Self-pay

## 2016-07-14 ENCOUNTER — Encounter (HOSPITAL_COMMUNITY): Payer: Self-pay

## 2016-07-14 DIAGNOSIS — F1910 Other psychoactive substance abuse, uncomplicated: Secondary | ICD-10-CM | POA: Insufficient documentation

## 2016-07-14 DIAGNOSIS — T148XXA Other injury of unspecified body region, initial encounter: Secondary | ICD-10-CM | POA: Insufficient documentation

## 2016-07-14 DIAGNOSIS — X58XXXA Exposure to other specified factors, initial encounter: Secondary | ICD-10-CM | POA: Insufficient documentation

## 2016-07-14 DIAGNOSIS — Y929 Unspecified place or not applicable: Secondary | ICD-10-CM | POA: Insufficient documentation

## 2016-07-14 DIAGNOSIS — Z79899 Other long term (current) drug therapy: Secondary | ICD-10-CM | POA: Insufficient documentation

## 2016-07-14 DIAGNOSIS — Y939 Activity, unspecified: Secondary | ICD-10-CM | POA: Insufficient documentation

## 2016-07-14 DIAGNOSIS — Y999 Unspecified external cause status: Secondary | ICD-10-CM | POA: Insufficient documentation

## 2016-07-14 LAB — COMPREHENSIVE METABOLIC PANEL
ALBUMIN: 4.8 g/dL (ref 3.5–5.0)
ALT: 19 U/L (ref 17–63)
AST: 26 U/L (ref 15–41)
Alkaline Phosphatase: 76 U/L (ref 38–126)
Anion gap: 11 (ref 5–15)
BILIRUBIN TOTAL: 1 mg/dL (ref 0.3–1.2)
BUN: 15 mg/dL (ref 6–20)
CO2: 25 mmol/L (ref 22–32)
CREATININE: 1.16 mg/dL (ref 0.61–1.24)
Calcium: 9.6 mg/dL (ref 8.9–10.3)
Chloride: 101 mmol/L (ref 101–111)
GFR calc Af Amer: >60 mL/min (ref >60)
GLUCOSE: 113 mg/dL — AB (ref 65–99)
POTASSIUM: 3.9 mmol/L (ref 3.5–5.1)
Sodium: 137 mmol/L (ref 135–145)
TOTAL PROTEIN: 9 g/dL — AB (ref 6.5–8.1)

## 2016-07-14 LAB — CBC
HEMATOCRIT: 42.7 % (ref 39.0–52.0)
Hemoglobin: 14.6 g/dL (ref 13.0–17.0)
MCH: 28.8 pg (ref 26.0–34.0)
MCHC: 34.2 g/dL (ref 30.0–36.0)
MCV: 84.2 fL (ref 78.0–100.0)
PLATELETS: 408 10*3/uL — AB (ref 150–400)
RBC: 5.07 MIL/uL (ref 4.22–5.81)
RDW: 13.4 % (ref 11.5–15.5)
WBC: 13 10*3/uL — AB (ref 4.0–10.5)

## 2016-07-14 LAB — URINALYSIS, ROUTINE W REFLEX MICROSCOPIC
BILIRUBIN URINE: NEGATIVE
GLUCOSE, UA: NEGATIVE mg/dL
HGB URINE DIPSTICK: NEGATIVE
Ketones, ur: NEGATIVE mg/dL
LEUKOCYTES UA: NEGATIVE
NITRITE: NEGATIVE
Protein, ur: 100 mg/dL — AB
SPECIFIC GRAVITY, URINE: 1.024 (ref 1.005–1.030)
pH: 5 (ref 5.0–8.0)

## 2016-07-14 LAB — RAPID URINE DRUG SCREEN, HOSP PERFORMED
AMPHETAMINES: NOT DETECTED
BENZODIAZEPINES: NOT DETECTED
Barbiturates: NOT DETECTED
Cocaine: POSITIVE — AB
Opiates: POSITIVE — AB
Tetrahydrocannabinol: POSITIVE — AB

## 2016-07-14 LAB — ETHANOL

## 2016-07-14 LAB — SALICYLATE LEVEL: Salicylate Lvl: <7 mg/dL (ref 2.8–30.0)

## 2016-07-14 LAB — I-STAT CG4 LACTIC ACID, ED: LACTIC ACID, VENOUS: 1.25 mmol/L (ref 0.5–1.9)

## 2016-07-14 LAB — ACETAMINOPHEN LEVEL: Acetaminophen (Tylenol), Serum: <10 ug/mL — ABNORMAL LOW (ref 10–30)

## 2016-07-14 LAB — CBG MONITORING, ED: GLUCOSE-CAPILLARY: 123 mg/dL — AB (ref 65–99)

## 2016-07-14 MED ORDER — LORAZEPAM 2 MG/ML IJ SOLN
1.0000 mg | Freq: Once | INTRAMUSCULAR | Status: AC
  Administered 2016-07-14: 1 mg via INTRAVENOUS
  Filled 2016-07-14: qty 1

## 2016-07-14 MED ORDER — SODIUM CHLORIDE 0.9 % IV BOLUS (SEPSIS): 1000.0000 mL | Freq: Once | INTRAVENOUS | Status: DC

## 2016-07-14 MED ORDER — SODIUM CHLORIDE 0.9 % IV BOLUS (SEPSIS)
1000.0000 mL | Freq: Once | INTRAVENOUS | Status: AC
  Administered 2016-07-14: 1000 mL via INTRAVENOUS

## 2016-07-14 NOTE — ED Triage Notes (Addendum)
Patient states that he has not thought about harming himself, that he is just disappointed in himself.  Patient states that he became really light headed and his chest felt like it was tightening up.

## 2016-07-14 NOTE — ED Notes (Signed)
Pt did well while ambulating 

## 2016-07-14 NOTE — ED Provider Notes (Signed)
[facility]PT Provider Note   CSN: [id number] Arrival date & time: 07/14/16  0008 By signing my name below, I, Levon Hedger, attest that this documentation has been prepared under the direction and in the presence of Glynn Octave, MD . Electronically Signed: Levon Hedger, Scribe. 07/14/2016. 12:56 AM.   History   Chief Complaint Chief Complaint  Patient presents with  . Drug Overdose    HPI Nicholas Ritter is a 31 y.o. male who presents to the Emergency Department complaining of sudden onset, severe lightheadedness s/p injecting OxyContin tonight PTA. Pt cut the pills into small pieces and melted the pills with a lighter, then shot up into his left hand and arm. Pt is a recovering heroin addict and states he had a relapse on 07/04/16. He was seen for a dental abscess on 07/03/16 and has used everyday since. Pt states he has used heroin in the past two weeks and used cocaine yesterday. No other drugs used tonight. He reports associated diaphoresis which he states always happens after injecting.  No alcohol use in 3 years. Last benzodiazepine use 6 months ago. He denies SI/HI, hallucinations, fever, CP, heart palpitations, SOB, or LOC.   The history is provided by the patient. No language interpreter was used.   Past Medical History:  Diagnosis Date  . Psoriasis     There are no active problems to display for this patient.   Past Surgical History:  Procedure Laterality Date  . APPENDECTOMY        Home Medications    Prior to Admission medications   Medication Sig Start Date End Date Taking? Authorizing Provider  clindamycin (CLEOCIN) 150 MG capsule Take 2 capsules (300 mg total) by mouth 4 (four) times daily. For 7 days 07/03/16  Yes Tammy Triplett, PA-C  ibuprofen (ADVIL,MOTRIN) 600 MG tablet Take 1 tablet (600 mg total) by mouth every 6 (six) hours as needed. 04/26/16  Yes Barrett Henle, PA-C  Melatonin 3 MG CAPS Take 3 capsules by mouth at bedtime.   Yes  Historical Provider, MD    Family History History reviewed. No pertinent family history.  Social History Social History  Substance Use Topics  . Smoking status: Never Smoker  . Smokeless tobacco: Never Used  . Alcohol use No    Allergies   Benzodiazepines and Opium   Review of Systems Review of Systems 10 systems reviewed and all are negative for acute change except as noted in the HPI.  Physical Exam Updated Vital Signs BP 119/75 (BP Location: Left Arm)   Pulse 93   Temp 99.2 F (37.3 C) (Rectal)   Resp 16   Ht 5\' 11"  (1.803 m)   Wt 210 lb (95.3 kg)   SpO2 99%   BMI 29.29 kg/m   Physical Exam  Constitutional: He is oriented to person, place, and time. He appears well-developed and well-nourished. No distress.  Cold, clammy.  HENT:  Head: Normocephalic and atraumatic.  Mouth/Throat: Oropharynx is clear and moist. No oropharyngeal exudate.  Eyes: Conjunctivae and EOM are normal. Pupils are equal, round, and reactive to light.  Neck: Normal range of motion. Neck supple.  No meningismus.  Cardiovascular: Regular rhythm, normal heart sounds and intact distal pulses.  Tachycardia present.   No murmur heard. Tachycardic to the 130's  Pulmonary/Chest: Effort normal and breath sounds normal. No respiratory distress.  Abdominal: Soft. There is no tenderness. There is no rebound and no guarding.  Musculoskeletal: Normal range of motion. He exhibits no edema or tenderness.  Neurological: He is alert and oriented to person, place, and time. No cranial nerve deficit. He exhibits normal muscle tone. Coordination normal.   5/5 strength throughout. CN 2-12 intact.Equal grip strength.   Skin: Skin is warm. He is diaphoretic.  Skin has multiple areas of abrasion and excoriating without abscess   Psychiatric: He has a normal mood and affect. His behavior is normal.  Nursing note and vitals reviewed.  ED Treatments / Results  DIAGNOSTIC STUDIES:  Oxygen Saturation is 100% on  RA, normal by my interpretation.    COORDINATION OF CARE:  12:38 AM Discussed treatment plan with pt at bedside and pt agreed to plan.   Labs (all labs ordered are listed, but only abnormal results are displayed) Labs Reviewed  COMPREHENSIVE METABOLIC PANEL - Abnormal; Notable for the following:       Result Value   Glucose, Bld 113 (*)    Total Protein 9.0 (*)    All other components within normal limits  ACETAMINOPHEN LEVEL - Abnormal; Notable for the following:    Acetaminophen (Tylenol), Serum <10 (*)    All other components within normal limits  CBC - Abnormal; Notable for the following:    WBC 13.0 (*)    Platelets 408 (*)    All other components within normal limits  RAPID URINE DRUG SCREEN, HOSP PERFORMED - Abnormal; Notable for the following:    Opiates POSITIVE (*)    Cocaine POSITIVE (*)    Tetrahydrocannabinol POSITIVE (*)    All other components within normal limits  URINALYSIS, ROUTINE W REFLEX MICROSCOPIC - Abnormal; Notable for the following:    APPearance HAZY (*)    Protein, ur 100 (*)    Bacteria, UA RARE (*)    All other components within normal limits  CBG MONITORING, ED - Abnormal; Notable for the following:    Glucose-Capillary 123 (*)    All other components within normal limits  CULTURE, BLOOD (ROUTINE X 2)  CULTURE, BLOOD (ROUTINE X 2)  ETHANOL  SALICYLATE LEVEL  I-STAT CG4 LACTIC ACID, ED    EKG  EKG Interpretation  Date/Time:  Monday July 14 2016 00:32:34 EST Ventricular Rate:  123 PR Interval:    QRS Duration: 98 QT Interval:  303 QTC Calculation: 434 R Axis:   73 Text Interpretation:  Sinus tachycardia LAE, consider biatrial enlargement Borderline repolarization abnormality No significant change was found Confirmed by Manus GunningANCOUR  MD, Markiya Keefe ([id number]) on 07/14/2016 12:58:24 AM       Radiology Dg Chest 2 View  Result Date: 07/14/2016 CLINICAL DATA:  Dyspnea and weakness EXAM: CHEST  2 VIEW COMPARISON:  05/02/2015 FINDINGS: The heart  size and mediastinal contours are within normal limits. Both lungs are clear. The visualized skeletal structures are unremarkable. IMPRESSION: No active cardiopulmonary disease. Electronically Signed   By: Tollie Ethavid  Kwon M.D.   On: 07/14/2016 02:15    Procedures Procedures (including critical care time)  Medications Ordered in ED Medications - No data to display   Initial Impression / Assessment and Plan / ED Course  I have reviewed the triage vital signs and the nursing notes.  Pertinent labs & imaging results that were available during my care of the patient were reviewed by me and considered in my medical decision making (see chart for details).     Lightheadedness, nausea, diaphoresis after injecting OxyContin tonight. He has been using it daily for the past 10 days.  Patient is tachycardic and diaphoretic. He denies any regular alcohol or benzodiazepine use.  Doubt alcohol or benzodiazepine withdrawal.  Patient given IV fluids and IV Ativan. He is afebrile.  Drug screen positive for opiates, cocaine and THC. Leukocytosis noted. Lactate normal. Patient afebrile. Blood culture sent. No murmurs on exam.  Heart rate improving to the 100s. Patient feeling improved. Diaphoresis is resolved. No chest pain or shortness of breath. Denies any suicidal or homicidal thoughts.  Patient anxious to go home. He is tolerating by mouth and ambulatory. No SI or HI. Heart rate is in the 100s. Discussed cessation of illicit drug use. He states he is going to a rehabilitation program tomorrow. Return precautions discussed. Final Clinical Impressions(s) / ED Diagnoses   Final diagnoses:  Polysubstance abuse   New Prescriptions New Prescriptions   No medications on file  I personally performed the services described in this documentation, which was scribed in my presence. The recorded information has been reviewed and is accurate.    Glynn Octave, MD 07/14/16 [phone]

## 2016-07-14 NOTE — ED Triage Notes (Signed)
He shot up 40 mg of oxycontin tonight.  He is a recovering heroin addict.  He was seen up here a week ago for a toothache and had a relapse.  He took shot up the medication because he started using heroin again and felt like he was having withdrawals.

## 2016-07-14 NOTE — Discharge Instructions (Signed)
Stop using drugs. Followup with your doctor. Return to the ED if you develop new or worsening symptoms. °

## 2016-07-14 NOTE — ED Notes (Signed)
Patient stated to the physician that he has been shooting up cocaine also.

## 2016-07-19 LAB — CULTURE, BLOOD (ROUTINE X 2)
CULTURE: NO GROWTH
CULTURE: NO GROWTH

## 2016-07-21 ENCOUNTER — Encounter (HOSPITAL_COMMUNITY): Payer: Self-pay | Admitting: Emergency Medicine

## 2016-07-21 ENCOUNTER — Emergency Department (HOSPITAL_COMMUNITY)
Admission: EM | Admit: 2016-07-21 | Discharge: 2016-07-21 | Disposition: A | Discharge status: Home / Self Care | Payer: Self-pay | Attending: Emergency Medicine | Admitting: Emergency Medicine

## 2016-07-21 DIAGNOSIS — T401X1A Poisoning by heroin, accidental (unintentional), initial encounter: Secondary | ICD-10-CM | POA: Insufficient documentation

## 2016-07-21 HISTORY — DX: Other psychoactive substance abuse, uncomplicated: F19.10

## 2016-07-21 LAB — CBC WITH DIFFERENTIAL/PLATELET
BASOS ABS: 0 10*3/uL (ref 0.0–0.1)
BASOS PCT: 0 %
EOS ABS: 0.1 10*3/uL (ref 0.0–0.7)
EOS PCT: 1 %
HEMATOCRIT: 38.9 % — AB (ref 39.0–52.0)
Hemoglobin: 13.4 g/dL (ref 13.0–17.0)
Lymphocytes Relative: 15 %
Lymphs Abs: 1.7 10*3/uL (ref 0.7–4.0)
MCH: 28.9 pg (ref 26.0–34.0)
MCHC: 34.4 g/dL (ref 30.0–36.0)
MCV: 83.8 fL (ref 78.0–100.0)
MONO ABS: 0.9 10*3/uL (ref 0.1–1.0)
MONOS PCT: 8 %
Neutro Abs: 8.5 10*3/uL — ABNORMAL HIGH (ref 1.7–7.7)
Neutrophils Relative %: 76 %
PLATELETS: 409 10*3/uL — AB (ref 150–400)
RBC: 4.64 MIL/uL (ref 4.22–5.81)
RDW: 13.2 % (ref 11.5–15.5)
WBC: 11.2 10*3/uL — ABNORMAL HIGH (ref 4.0–10.5)

## 2016-07-21 LAB — COMPREHENSIVE METABOLIC PANEL
ALBUMIN: 4.3 g/dL (ref 3.5–5.0)
ALK PHOS: 61 U/L (ref 38–126)
ALT: 18 U/L (ref 17–63)
AST: 20 U/L (ref 15–41)
Anion gap: 7 (ref 5–15)
BILIRUBIN TOTAL: 0.9 mg/dL (ref 0.3–1.2)
BUN: 11 mg/dL (ref 6–20)
CALCIUM: 9.1 mg/dL (ref 8.9–10.3)
CO2: 27 mmol/L (ref 22–32)
CREATININE: 0.96 mg/dL (ref 0.61–1.24)
Chloride: 100 mmol/L — ABNORMAL LOW (ref 101–111)
GFR calc Af Amer: >60 mL/min (ref >60)
GLUCOSE: 98 mg/dL (ref 65–99)
POTASSIUM: 4.2 mmol/L (ref 3.5–5.1)
Sodium: 134 mmol/L — ABNORMAL LOW (ref 135–145)
TOTAL PROTEIN: 7.6 g/dL (ref 6.5–8.1)

## 2016-07-21 LAB — ETHANOL

## 2016-07-21 NOTE — ED Notes (Signed)
Family wanded, visiting pt. Sitter at the bedside per order

## 2016-07-21 NOTE — ED Triage Notes (Signed)
EMS called to home, Denies SI, states under a lot of stress. Used heroin and cocaine, out of rehab 5 days ago. Family used 2 narcan, pt was alert when EMS arrived, HR 170, MD at the bedside

## 2016-07-21 NOTE — ED Provider Notes (Signed)
[facility] Provider Note   CSN: [id number] Arrival date & time: 07/21/16  1808     History   Chief Complaint Chief Complaint  Patient presents with  . Drug Overdose    HPI Nicholas Ritter is a 31 y.o. male.  HPI  31 y.o. Male history of substance abuse   Out of [facility] in [place] for 3 days.  Shot up heroin and cocaine.  Family found unresponive and gave narcan x 2 pre ems. EMS states awake and alert but transported due to high hr.  Patient denies si/hi/depression.    Past Medical History:  Diagnosis Date  . Drug abuse   . Psoriasis     There are no active problems to display for this patient.   Past Surgical History:  Procedure Laterality Date  . APPENDECTOMY         Home Medications    Prior to Admission medications   Medication Sig Start Date End Date Taking? Authorizing Provider  clindamycin (CLEOCIN) 150 MG capsule Take 2 capsules (300 mg total) by mouth 4 (four) times daily. For 7 days 07/03/16   Tammy Triplett, PA-C  ibuprofen (ADVIL,MOTRIN) 600 MG tablet Take 1 tablet (600 mg total) by mouth every 6 (six) hours as needed. 04/26/16   Barrett HenleNicole Elizabeth Nadeau, PA-C  Melatonin 3 MG CAPS Take 3 capsules by mouth at bedtime.    Historical Provider, MD    Family History No family history on file.  Social History Social History  Substance Use Topics  . Smoking status: Never Smoker  . Smokeless tobacco: Never Used  . Alcohol use No     Allergies   Benzodiazepines and Opium   Review of Systems Review of Systems  All other systems reviewed and are negative.    Physical Exam Updated Vital Signs BP 135/86 (BP Location: Right Arm)   Pulse 120   Temp 99.6 F (37.6 C) (Oral)   Resp 16   Ht 5\' 11"  (1.803 m)   Wt 93 kg   SpO2 100%   BMI 28.59 kg/m   Physical Exam  Constitutional: He appears well-developed and well-nourished. No distress.  HENT:  Head: Normocephalic and atraumatic.  Right Ear: External ear normal.  Left Ear: External ear  normal.  Nose: Nose normal.  Mouth/Throat: Oropharynx is clear and moist.  Eyes: Conjunctivae and EOM are normal. Pupils are equal, round, and reactive to light.  Neck: Normal range of motion. Neck supple.  Cardiovascular: Normal rate, regular rhythm and normal heart sounds.   Pulmonary/Chest: Effort normal and breath sounds normal.  Abdominal: Bowel sounds are normal.  Musculoskeletal: Normal range of motion.  Neurological: He is alert.  Skin: Skin is warm.  Multiple excoriated areas c.w. Ivda, no abscesses noted +psoriasis  Psychiatric: He has a normal mood and affect. His behavior is normal. Thought content normal.  Nursing note and vitals reviewed.    ED Treatments / Results  Labs (all labs ordered are listed, but only abnormal results are displayed) Labs Reviewed  COMPREHENSIVE METABOLIC PANEL  ETHANOL  CBC WITH DIFFERENTIAL/PLATELET  RAPID URINE DRUG SCREEN, HOSP PERFORMED    EKG  EKG Interpretation None       Radiology No results found.  Procedures Procedures (including critical care time)  Medications Ordered in ED Medications - No data to display   Initial Impression / Assessment and Plan / ED Course  I have reviewed the triage vital signs and the nursing notes.  Pertinent labs & imaging results that were available  during my care of the patient were reviewed by me and considered in my medical decision making (see chart for details).     8:10 PM Awake and alert 3 hours post Narcan. Discussed with the patient, mother, and girlfriend. Mother will get Narcan at drugstore. Return precautions  Final Clinical Impressions(s) / ED Diagnoses   Final diagnoses:  Accidental overdose of heroin, initial encounter    New Prescriptions New Prescriptions   No medications on file     Margarita Grizzle, MD 07/21/16 2010

## 2016-07-21 NOTE — ED Notes (Signed)
Lab at the bedside 

## 2016-07-21 NOTE — ED Triage Notes (Signed)
CBG at home 152

## 2016-07-21 NOTE — ED Notes (Signed)
Patient given discharge instruction, verbalized understand. IV removed, band aid applied. Patient ambulatory out of the department with mother and girl friend

## 2016-11-05 ENCOUNTER — Encounter (HOSPITAL_COMMUNITY): Payer: Self-pay | Admitting: *Deleted

## 2016-11-05 ENCOUNTER — Emergency Department (HOSPITAL_COMMUNITY)
Admission: EM | Admit: 2016-11-05 | Discharge: 2016-11-05 | Disposition: A | Discharge status: Home / Self Care | Payer: Self-pay | Attending: Emergency Medicine | Admitting: Emergency Medicine

## 2016-11-05 ENCOUNTER — Emergency Department (HOSPITAL_COMMUNITY): Payer: Self-pay

## 2016-11-05 DIAGNOSIS — M545 Low back pain: Secondary | ICD-10-CM | POA: Insufficient documentation

## 2016-11-05 DIAGNOSIS — M79661 Pain in right lower leg: Secondary | ICD-10-CM | POA: Insufficient documentation

## 2016-11-05 DIAGNOSIS — F1490 Cocaine use, unspecified, uncomplicated: Secondary | ICD-10-CM | POA: Insufficient documentation

## 2016-11-05 HISTORY — DX: Other psychoactive substance abuse, uncomplicated: F19.10

## 2016-11-05 MED ORDER — ACETAMINOPHEN 500 MG PO TABS
1000.0000 mg | ORAL_TABLET | Freq: Once | ORAL | Status: AC
  Administered 2016-11-05: 1000 mg via ORAL
  Filled 2016-11-05: qty 2

## 2016-11-05 MED ORDER — CYCLOBENZAPRINE HCL 10 MG PO TABS
10.0000 mg | ORAL_TABLET | Freq: Once | ORAL | Status: AC
  Administered 2016-11-05: 10 mg via ORAL
  Filled 2016-11-05: qty 1

## 2016-11-05 MED ORDER — PREDNISONE 10 MG (21) PO TBPK: ORAL_TABLET | Freq: Every day | ORAL | 0 refills | Status: AC

## 2016-11-05 MED ORDER — IBUPROFEN 400 MG PO TABS
400.0000 mg | ORAL_TABLET | Freq: Once | ORAL | Status: AC
  Administered 2016-11-05: 400 mg via ORAL
  Filled 2016-11-05: qty 1

## 2016-11-05 MED ORDER — METHOCARBAMOL 500 MG PO TABS: 1000.0000 mg | ORAL_TABLET | Freq: Four times a day (QID) | ORAL | 0 refills | Status: AC | PRN

## 2016-11-05 NOTE — ED Provider Notes (Signed)
[facility] Provider Note   CSN: [id number] Arrival date & time: 11/05/16  0546     History   Chief Complaint Chief Complaint  Patient presents with  . Leg Pain    HPI Nicholas Ritter is a 31 y.o. male.  HPI  Pt was seen at 0720. Per pt, c/o gradual onset and persistence of constant right sided low back "pain" for the past 2 days. Pain worsens with palpation of the area and body position changes. Describes the pain as "aching." Denies incont/retention of bowel or bladder, no saddle anesthesia, no focal motor weakness, no tingling/numbness in extremities, no fevers, no injury, no abd pain, no dysuria/hematuria.   The symptoms have been associated with no other complaints.    Past Medical History:  Diagnosis Date  . Drug abuse   . Polysubstance abuse   . Psoriasis     There are no active problems to display for this patient.   Past Surgical History:  Procedure Laterality Date  . APPENDECTOMY         Home Medications    Prior to Admission medications   Medication Sig Start Date End Date Taking? Authorizing Provider  clindamycin (CLEOCIN) 150 MG capsule Take 2 capsules (300 mg total) by mouth 4 (four) times daily. For 7 days Patient not taking: Reported on 07/21/2016 07/03/16   Pauline Ausriplett, Tammy, PA-C  ibuprofen (ADVIL,MOTRIN) 600 MG tablet Take 1 tablet (600 mg total) by mouth every 6 (six) hours as needed. Patient not taking: Reported on 07/21/2016 04/26/16   Barrett HenleNadeau, Nicole Elizabeth, PA-C  Melatonin 3 MG CAPS Take 3 capsules by mouth at bedtime.    [provider]    Family History History reviewed. No pertinent family history.  Social History Social History  Substance Use Topics  . Smoking status: Never Smoker  . Smokeless tobacco: Never Used  . Alcohol use No     Allergies   Benzodiazepines and Opium   Review of Systems Review of Systems ROS: Statement: All systems negative except as marked or noted in the HPI; Constitutional: Negative for  fever and chills. ; ; Eyes: Negative for eye pain, redness and discharge. ; ; ENMT: Negative for ear pain, hoarseness, nasal congestion, sinus pressure and sore throat. ; ; Cardiovascular: Negative for chest pain, palpitations, diaphoresis, dyspnea and peripheral edema. ; ; Respiratory: Negative for cough, wheezing and stridor. ; ; Gastrointestinal: Negative for nausea, vomiting, diarrhea, abdominal pain, blood in stool, hematemesis, jaundice and rectal bleeding. . ; ; Genitourinary: Negative for dysuria, flank pain and hematuria. ; ; Musculoskeletal: +LBP. Negative for neck pain. Negative for swelling and trauma.; ; Skin: Negative for pruritus, rash, abrasions, blisters, bruising and skin lesion.; ; Neuro: Negative for headache, lightheadedness and neck stiffness. Negative for weakness, altered level of consciousness, altered mental status, extremity weakness, paresthesias, involuntary movement, seizure and syncope.       Physical Exam Updated Vital Signs BP 117/65 (BP Location: Left Arm)   Pulse 77   Temp 99.5 F (37.5 C) (Oral)   Resp 16   Ht 5\' 10"  (1.778 m)   Wt 220 lb (99.8 kg)   SpO2 96%   BMI 31.57 kg/m   Physical Exam 0725: Physical examination:  Nursing notes reviewed; Vital signs and O2 SAT reviewed;  Constitutional: Well developed, Well nourished, Well hydrated, In no acute distress; Head:  Normocephalic, atraumatic; Eyes: EOMI, PERRL, No scleral icterus; ENMT: Mouth and pharynx normal, Mucous membranes moist; Neck: Supple, Full range of motion, No lymphadenopathy; Cardiovascular:  Regular rate and rhythm, No murmur, rub, or gallop; Respiratory: Breath sounds clear & equal bilaterally, No rales, rhonchi, wheezes.  Speaking full sentences with ease, Normal respiratory effort/excursion; Chest: Nontender, Movement normal; Abdomen: Soft, Nontender, Nondistended, Normal bowel sounds; Spine:  No midline CS, TS, LS tenderness. +TTP right lumbar paraspinal muscles.;; Genitourinary: No CVA  tenderness; Extremities: Pulses normal, No tenderness, No edema, No calf edema or asymmetry.; Neuro: AA&Ox3, Major CN grossly intact.  Speech clear. No gross focal motor or sensory deficits in extremities. Strength 5/5 equal bilat UE's and LE's, including great toe dorsiflexion.  DTR 2/4 equal bilat UE's and LE's.  No gross sensory deficits.  Neg straight leg raises bilat.; Skin: Color normal, Warm, Dry.   ED Treatments / Results  Labs (all labs ordered are listed, but only abnormal results are displayed)   EKG  EKG Interpretation None       Radiology   Procedures Procedures (including critical care time)  Medications Ordered in ED Medications  acetaminophen (TYLENOL) tablet 1,000 mg (not administered)  ibuprofen (ADVIL,MOTRIN) tablet 400 mg (not administered)  cyclobenzaprine (FLEXERIL) tablet 10 mg (not administered)     Initial Impression / Assessment and Plan / ED Course  I have reviewed the triage vital signs and the nursing notes.  Pertinent labs & imaging results that were available during my care of the patient were reviewed by me and considered in my medical decision making (see chart for details).  MDM Reviewed: previous chart, nursing note and vitals Interpretation: x-ray    Dg Lumbar Spine Complete Result Date: 11/05/2016 CLINICAL DATA:  Low back pain with right leg pain EXAM: LUMBAR SPINE - COMPLETE 4+ VIEW COMPARISON:  None. FINDINGS: There is no evidence of lumbar spine fracture. Alignment is normal. Intervertebral disc spaces are maintained. IMPRESSION: Negative. Electronically Signed   By: Marlan Palau M.D.   On: 11/05/2016 08:12    0825:  XR reassuring. Tx symptomatically at this time. Dx and testing d/w pt.  Questions answered.  Verb understanding, agreeable to d/c home with outpt f/u.     Final Clinical Impressions(s) / ED Diagnoses   Final diagnoses:  None    New Prescriptions New Prescriptions   No medications on file     [name], DO 11/11/16 1610

## 2016-11-05 NOTE — ED Triage Notes (Signed)
Pt c/o right side back pain with radiation and numbness down right leg; pt denies any obvious injury

## 2016-11-05 NOTE — Discharge Instructions (Signed)
Take the prescriptions as directed.  Apply moist heat or ice to the area(s) of discomfort, for 15 minutes at a time, several times per day for the next few days.  Do not fall asleep on a heating or ice pack.  Call your regular medical doctor today to schedule a follow up appointment this week.  Return to the Emergency Department immediately if worsening. ° °

## 2016-11-05 NOTE — ED Notes (Signed)
Pt made aware to return if symptoms worsen or if any life threatening symptoms occur.   

## 2017-08-20 ENCOUNTER — Other Ambulatory Visit: Payer: Self-pay

## 2017-08-20 ENCOUNTER — Emergency Department (HOSPITAL_COMMUNITY)
Admission: EM | Admit: 2017-08-20 | Discharge: 2017-08-21 | Disposition: A | Discharge status: Home / Self Care | Payer: Self-pay | Attending: Emergency Medicine | Admitting: Emergency Medicine

## 2017-08-20 ENCOUNTER — Encounter (HOSPITAL_COMMUNITY): Payer: Self-pay | Admitting: *Deleted

## 2017-08-20 DIAGNOSIS — W260XXA Contact with knife, initial encounter: Secondary | ICD-10-CM | POA: Insufficient documentation

## 2017-08-20 DIAGNOSIS — Z23 Encounter for immunization: Secondary | ICD-10-CM | POA: Insufficient documentation

## 2017-08-20 DIAGNOSIS — Y998 Other external cause status: Secondary | ICD-10-CM | POA: Insufficient documentation

## 2017-08-20 DIAGNOSIS — Y9389 Activity, other specified: Secondary | ICD-10-CM | POA: Insufficient documentation

## 2017-08-20 DIAGNOSIS — Z79899 Other long term (current) drug therapy: Secondary | ICD-10-CM | POA: Insufficient documentation

## 2017-08-20 DIAGNOSIS — Y929 Unspecified place or not applicable: Secondary | ICD-10-CM | POA: Insufficient documentation

## 2017-08-20 DIAGNOSIS — S61212A Laceration without foreign body of right middle finger without damage to nail, initial encounter: Secondary | ICD-10-CM | POA: Insufficient documentation

## 2017-08-20 NOTE — ED Triage Notes (Signed)
Pt states he was cutting carpet and accidentally sliced the top of right middle finger off around 10am today; finger is wrapped at this time

## 2017-08-21 ENCOUNTER — Emergency Department (HOSPITAL_COMMUNITY): Payer: Self-pay

## 2017-08-21 MED ORDER — TETANUS-DIPHTH-ACELL PERTUSSIS 5-2.5-18.5 LF-MCG/0.5 IM SUSP
0.5000 mL | Freq: Once | INTRAMUSCULAR | Status: AC
  Administered 2017-08-21: 0.5 mL via INTRAMUSCULAR
  Filled 2017-08-21: qty 0.5

## 2017-08-21 MED ORDER — IBUPROFEN 800 MG PO TABS: 800.0000 mg | ORAL_TABLET | Freq: Three times a day (TID) | ORAL | 0 refills | Status: DC | PRN

## 2017-08-21 NOTE — ED Notes (Signed)
Pt ambulatory to waiting room. Pt verbalized understanding of discharge instructions.   

## 2017-08-21 NOTE — Discharge Instructions (Signed)
Wash your wound twice daily usig mild soap and water, rinse, dry then apply a new layer of the xeroform followed by layers of dressing to pad the tip of your wound.  It would be ok to leave it uncovered when not working or doing activities that might cause injury to the wound. Get rechecked for any worsened pain, swelling, redness or drainage. This wound should heal without difficulty, and hopefully will be getting fully scabbed over within about a week.

## 2017-08-21 NOTE — ED Provider Notes (Signed)
[facility]GENCY DEPARTMENT Provider Note   CSN: [id number] Arrival date & time: 08/20/17  2320     History   Chief Complaint Chief Complaint  Patient presents with  . Laceration    HPI Nicholas Ritter is a 32 y.o. male.  The history is provided by the patient and the spouse.  Laceration   The incident occurred 12 to 24 hours ago. The laceration is located on the right hand. Size: avulsion of distal right long finger. The laceration mechanism was a a dirty knife and a clean knife (he was cutting carpet using a brand new carpet knife). The pain is mild. The pain has been constant since onset. He reports no foreign bodies present. His tetanus status is out of date.    Past Medical History:  Diagnosis Date  . Drug abuse (HCC)   . Polysubstance abuse (HCC)   . Psoriasis     There are no active problems to display for this patient.   Past Surgical History:  Procedure Laterality Date  . APPENDECTOMY         Home Medications    Prior to Admission medications   Medication Sig Start Date End Date Taking? Authorizing Provider  clindamycin (CLEOCIN) 150 MG capsule Take 2 capsules (300 mg total) by mouth 4 (four) times daily. For 7 days Patient not taking: Reported on 07/21/2016 07/03/16   Pauline Ausriplett, Tammy, PA-C  Melatonin 3 MG CAPS Take 3 capsules by mouth at bedtime.    [provider]  methocarbamol (ROBAXIN) 500 MG tablet Take 2 tablets (1,000 mg total) by mouth 4 (four) times daily as needed for muscle spasms (muscle spasm/pain). 11/05/16   Samuel JesterMcManus, Kathleen, DO  predniSONE (STERAPRED UNI-PAK 21 TAB) 10 MG (21) TBPK tablet Take by mouth daily. Take 6 tabs by mouth daily  for 2 days, then 5 tabs for 2 days, then 4 tabs for 2 days, then 3 tabs for 2 days, 2 tabs for 2 days, then 1 tab by mouth daily for 2 days 11/05/16   Samuel JesterMcManus, Kathleen, DO    Family History History reviewed. No pertinent family history.  Social History Social History   Tobacco Use  . Smoking  status: Never Smoker  . Smokeless tobacco: Never Used  Substance Use Topics  . Alcohol use: No  . Drug use: Yes    Types: Cocaine    Comment: Pt states that he mixes his cocaine and heroine together. States that he uses everyday..  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  Last used in Nov.     Allergies   Benzodiazepines and Opium   Review of Systems Review of Systems  Constitutional: Negative for chills and fever.  Respiratory: Negative for shortness of breath.   Musculoskeletal: Positive for arthralgias.  Skin: Positive for wound.  Neurological: Negative for numbness.     Physical Exam Updated Vital Signs BP 130/86 (BP Location: Left Arm)   Pulse 89   Temp 99.5 F (37.5 C) (Oral)   Resp 18   Ht 5\' 10"  (1.778 m)   Wt 104.3 kg (230 lb)   SpO2 97%   BMI 33.00 kg/m   Physical Exam  Constitutional: He is oriented to person, place, and time. He appears well-developed and well-nourished.  HENT:  Head: Normocephalic.  Cardiovascular: Normal rate.  Pulmonary/Chest: Effort normal.  Musculoskeletal: He exhibits no tenderness.  Neurological: He is alert and oriented to person, place, and time. No sensory deficit.  Skin: Laceration noted.  Deep avulsion of  the distal finger, right long which ends just at the edge of the distal nail.  There is no nailbed involvement. No visible bone. Hemostatic. Distal sensation intact.     ED Treatments / Results  Labs (all labs ordered are listed, but only abnormal results are displayed) Labs Reviewed - No data to display  EKG  EKG Interpretation None       Radiology Dg Finger Middle Right  Result Date: 08/21/2017 CLINICAL DATA:  32 year old male with laceration of the distal left middle finger. EXAM: RIGHT MIDDLE FINGER 2+V COMPARISON:  None. FINDINGS: There is no acute fracture or dislocation. The bones are well mineralized. No arthritic changes. There is laceration and skin defect of the tip of the distal third digit. No radiopaque  foreign object. IMPRESSION: No acute/traumatic osseous pathology. Electronically Signed   By: Elgie Collard M.D.   On: 08/21/2017 00:48    Procedures Procedures (including critical care time)  Wound is not amenable to suturing as it is a complete avulsion. The wound appears clean. Xeroform was applied followed by bulky dressing after cleaning with wound cleaner spray. (Patient had washed the wound, applied neosporin and kept it covered for the remainder of his work shift).   Medications Ordered in ED Medications  Tdap (BOOSTRIX) injection 0.5 mL (not administered)     Initial Impression / Assessment and Plan / ED Course  I have reviewed the triage vital signs and the nursing notes.  Pertinent labs & imaging results that were available during my care of the patient were reviewed by me and considered in my medical decision making (see chart for details).     Finger avulsion not amenable to sutures.  Wound care provided, home care discussed. Tetanus updated.  Advised recheck here for any worsening sx, drainage, redness, worse pain.  Final Clinical Impressions(s) / ED Diagnoses   Final diagnoses:  Laceration of right middle finger without foreign body without damage to nail, initial encounter    ED Discharge Orders        Ordered    ibuprofen (ADVIL,MOTRIN) 800 MG tablet  Every 8 hours PRN,   Status:  Discontinued     08/21/17 0058       Burgess Amor, PA-C 08/21/17 0126    Zadie Rhine, MD 08/21/17 [phone]

## 2017-10-10 IMAGING — DX DG CHEST 2V
2 series · 2 of 2 positions shown · non-contrast
Comparison: None.

CLINICAL DATA: Cold symptoms for 4 days.  Tooth ache for 1 month.

EXAM:
CHEST  2 VIEW

[chest pa]
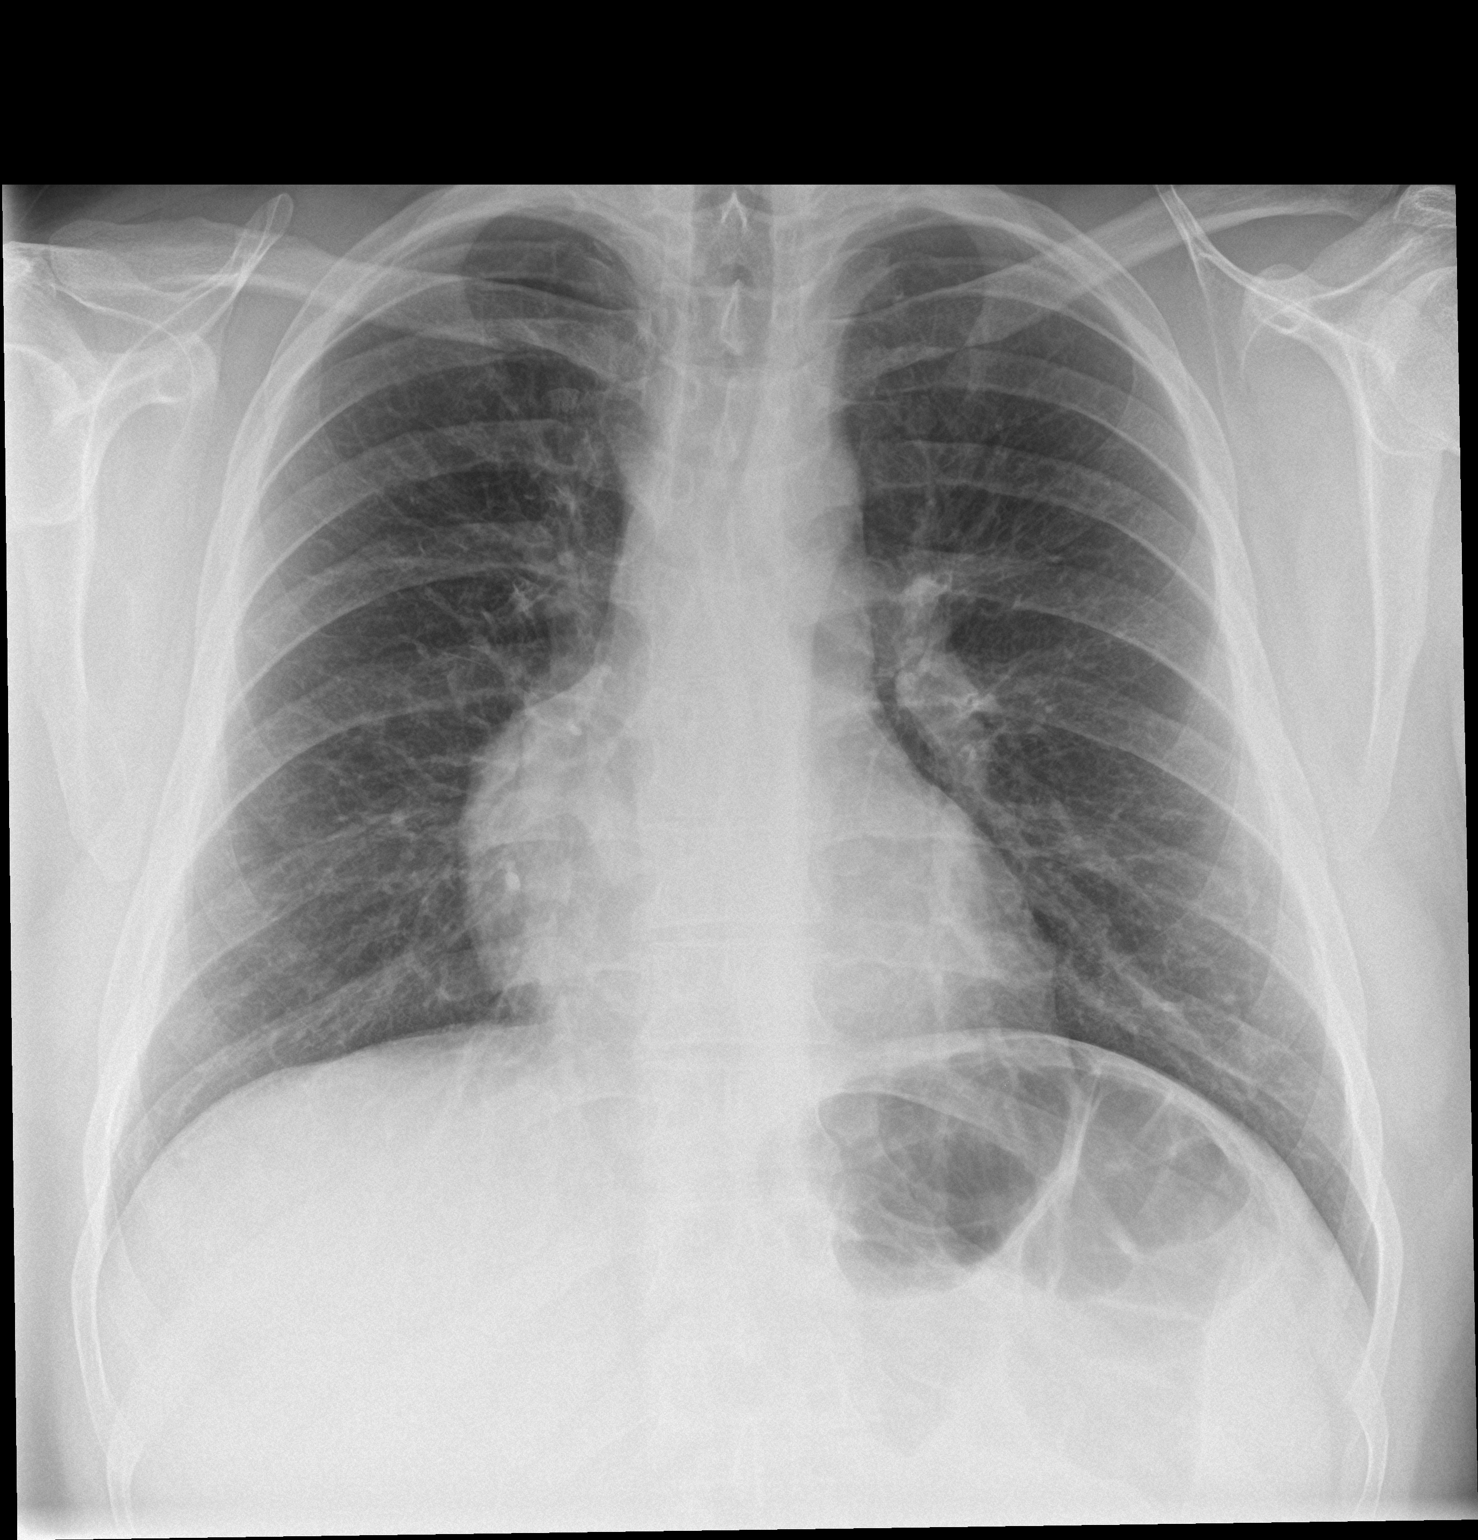

[chest lat]
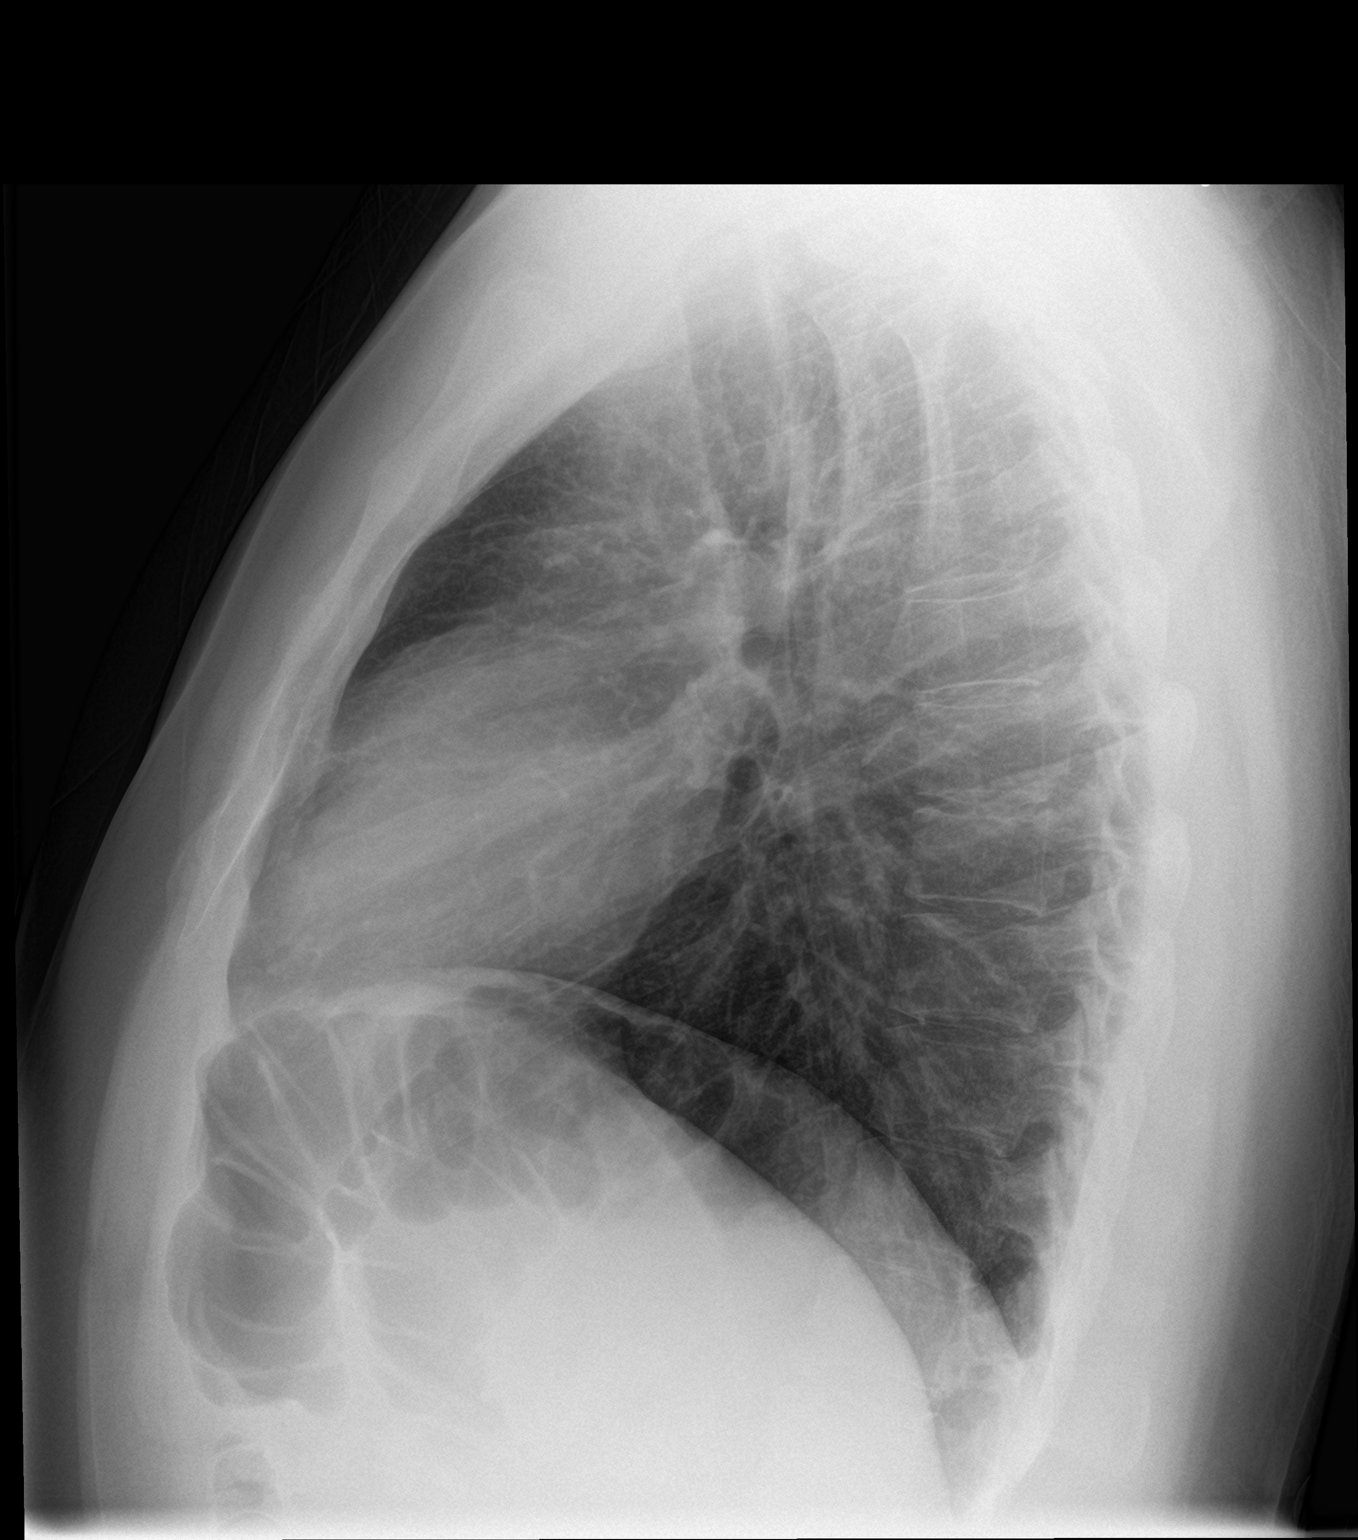

[2 of 2 positions shown; findings below may reference images not displayed]

FINDINGS: Midline trachea.  Normal heart size and mediastinal contours.

Sharp costophrenic angles.  No pneumothorax.  Clear lungs.
IMPRESSION: Normal chest.

## 2019-05-03 ENCOUNTER — Other Ambulatory Visit: Payer: Self-pay | Admitting: *Deleted

## 2019-05-03 DIAGNOSIS — Z20822 Contact with and (suspected) exposure to covid-19: Secondary | ICD-10-CM

## 2019-05-04 LAB — NOVEL CORONAVIRUS, NAA: SARS-CoV-2, NAA: NOT DETECTED

## 2019-05-05 ENCOUNTER — Telehealth: Payer: Self-pay | Admitting: General Practice

## 2019-05-05 NOTE — Telephone Encounter (Signed)
Patient called to receive his negative COVID test results. Patient expressed understanding. °
